# Patient Record
Sex: Female | Born: 1985 | Race: White | Hispanic: Yes | Marital: Single | Smoking: Never smoker
Health system: Southern US, Community
[De-identification: ages and names within clinical notes are randomized; demographics above are authoritative.]

## PROBLEM LIST (undated history)

## (undated) HISTORY — PX: TONSILLECTOMY: SUR1361

---

## 2013-04-04 ENCOUNTER — Encounter (HOSPITAL_COMMUNITY): Payer: Self-pay | Admitting: Emergency Medicine

## 2013-04-04 ENCOUNTER — Emergency Department (HOSPITAL_COMMUNITY)
Admission: EM | Admit: 2013-04-04 | Discharge: 2013-04-04 | Disposition: A | Discharge status: Home / Self Care | Payer: Managed Care, Other (non HMO) | Attending: Emergency Medicine | Admitting: Emergency Medicine

## 2013-04-04 DIAGNOSIS — Z79899 Other long term (current) drug therapy: Secondary | ICD-10-CM | POA: Insufficient documentation

## 2013-04-04 DIAGNOSIS — Z3202 Encounter for pregnancy test, result negative: Secondary | ICD-10-CM | POA: Insufficient documentation

## 2013-04-04 DIAGNOSIS — R42 Dizziness and giddiness: Secondary | ICD-10-CM

## 2013-04-04 LAB — CBC WITH DIFFERENTIAL/PLATELET
Eosinophils Relative: 1 % (ref 0–5)
HCT: 39.4 % (ref 36.0–46.0)
Lymphocytes Relative: 28 % (ref 12–46)
Lymphs Abs: 2.4 10*3/uL (ref 0.7–4.0)
MCV: 88.3 fL (ref 78.0–100.0)
Neutro Abs: 5.6 10*3/uL (ref 1.7–7.7)
Platelets: 289 10*3/uL (ref 150–400)
RBC: 4.46 MIL/uL (ref 3.87–5.11)
WBC: 8.7 10*3/uL (ref 4.0–10.5)

## 2013-04-04 LAB — URINALYSIS, ROUTINE W REFLEX MICROSCOPIC
Bilirubin Urine: NEGATIVE
Hgb urine dipstick: NEGATIVE
Ketones, ur: NEGATIVE mg/dL
Nitrite: NEGATIVE
Protein, ur: NEGATIVE mg/dL
Specific Gravity, Urine: 1.014 (ref 1.005–1.030)
Urobilinogen, UA: 0.2 mg/dL (ref 0.0–1.0)

## 2013-04-04 LAB — COMPREHENSIVE METABOLIC PANEL
ALT: 9 U/L (ref 0–35)
AST: 28 U/L (ref 0–37)
Alkaline Phosphatase: 63 U/L (ref 39–117)
CO2: 26 mEq/L (ref 19–32)
Calcium: 10.1 mg/dL (ref 8.4–10.5)
Chloride: 101 mEq/L (ref 96–112)
GFR calc Af Amer: >90 mL/min (ref >90)
GFR calc non Af Amer: 85 mL/min — ABNORMAL LOW (ref >90)
Glucose, Bld: 94 mg/dL (ref 70–99)
Sodium: 138 mEq/L (ref 135–145)
Total Bilirubin: 0.5 mg/dL (ref 0.3–1.2)

## 2013-04-04 MED ORDER — MECLIZINE HCL 25 MG PO TABS: 25.0000 mg | ORAL_TABLET | Freq: Three times a day (TID) | ORAL | Status: AC | PRN

## 2013-04-04 NOTE — ED Notes (Signed)
PT. REPORTS DIZZINESS , LIGHTHEADED , NAUSEA , SLIGHT CHILLS WITH BODY ACHES ONSET TODAY .

## 2013-04-04 NOTE — ED Notes (Signed)
MD at bedside. 

## 2013-04-04 NOTE — ED Provider Notes (Signed)
CSN: [id number]     Arrival date & time 04/04/13  0157 History     First MD Initiated Contact with Patient 04/04/13 [phone]     Chief Complaint  Patient presents with  . Dizziness   (Consider location/radiation/quality/duration/timing/severity/associated sxs/prior Treatment) The history is provided by the patient.   27 year old female had onset at about 9 PM of a vague sense of dizziness. She states it felt like she was looking through a tunnel and things were moving slowly. This is associated with nausea. She did have some intermittent sweating along with that. She denies fever, chills. She denies arthralgias or myalgias. She felt better when she is laying down but nothing seemed to make her symptoms worse. She actually is feeling better now that she is in the ED.  History reviewed. No pertinent past medical history. History reviewed. No pertinent past surgical history. No family history on file. History  Substance Use Topics  . Smoking status: Never Smoker   . Smokeless tobacco: Not on file  . Alcohol Use: Yes   OB History   Grav Para Term Preterm Abortions TAB SAB Ect Mult Living                 Review of Systems  All other systems reviewed and are negative.    Allergies  Review of patient's allergies indicates no known allergies.  Home Medications   Current Outpatient Rx  Name  Route  Sig  Dispense  Refill  . citalopram (CELEXA) 10 MG tablet   Oral   Take 10 mg by mouth daily.         . meclizine (ANTIVERT) 25 MG tablet   Oral   Take 1 tablet (25 mg total) by mouth 3 (three) times daily as needed.   30 tablet   0    BP 106/63  Pulse 64  Temp(Src) 98.1 F (36.7 C) (Oral)  Resp 16  SpO2 99%  LMP 04/03/2013 Physical Exam  Nursing note and vitals reviewed.  27 year old female, resting comfortably and in no acute distress. Vital signs are normal. Oxygen saturation is 99%, which is normal. Head is normocephalic and atraumatic. PERRLA, EOMI. Oropharynx is  clear.  Slight nystagmus is noted on right lateral gaze without reproducing her symptoms. Neck is nontender and supple without adenopathy or JVD. Back is nontender and there is no CVA tenderness. Lungs are clear without rales, wheezes, or rhonchi. Chest is nontender. Heart has regular rate and rhythm without murmur. Abdomen is soft, flat, nontender without masses or hepatosplenomegaly and peristalsis is normoactive. Extremities have no cyanosis or edema, full range of motion is present. Skin is warm and dry without rash. Neurologic: Mental status is normal, cranial nerves are intact, there are no motor or sensory deficits. Dizziness is incompletely reproduced by head movement.  ED Course   Procedures (including critical care time)  Results for orders placed during the hospital encounter of 04/04/13  CBC WITH DIFFERENTIAL      Result Value Range   WBC 8.7  4.0 - 10.5 K/uL   RBC 4.46  3.87 - 5.11 MIL/uL   Hemoglobin 14.0  12.0 - 15.0 g/dL   HCT 11.9  14.7 - 82.9 %   MCV 88.3  78.0 - 100.0 fL   MCH 31.4  26.0 - 34.0 pg   MCHC 35.5  30.0 - 36.0 g/dL   RDW 56.2  13.0 - 86.5 %   Platelets 289  150 - 400 K/uL   Neutrophils Relative %  64  43 - 77 %   Neutro Abs 5.6  1.7 - 7.7 K/uL   Lymphocytes Relative 28  12 - 46 %   Lymphs Abs 2.4  0.7 - 4.0 K/uL   Monocytes Relative 6  3 - 12 %   Monocytes Absolute 0.5  0.1 - 1.0 K/uL   Eosinophils Relative 1  0 - 5 %   Eosinophils Absolute 0.0  0.0 - 0.7 K/uL   Basophils Relative 1  0 - 1 %   Basophils Absolute 0.1  0.0 - 0.1 K/uL  COMPREHENSIVE METABOLIC PANEL      Result Value Range   Sodium 138  135 - 145 mEq/L   Potassium 3.7  3.5 - 5.1 mEq/L   Chloride 101  96 - 112 mEq/L   CO2 26  19 - 32 mEq/L   Glucose, Bld 94  70 - 99 mg/dL   BUN 16  6 - 23 mg/dL   Creatinine, Ser 0.98  0.50 - 1.10 mg/dL   Calcium 11.9  8.4 - 14.7 mg/dL   Total Protein 7.7  6.0 - 8.3 g/dL   Albumin 4.5  3.5 - 5.2 g/dL   AST 28  0 - 37 U/L   ALT 9  0 - 35 U/L    Alkaline Phosphatase 63  39 - 117 U/L   Total Bilirubin 0.5  0.3 - 1.2 mg/dL   GFR calc non Af Amer 85 (*) >90 mL/min   GFR calc Af Amer >90  >90 mL/min  URINALYSIS, ROUTINE W REFLEX MICROSCOPIC      Result Value Range   Color, Urine YELLOW  YELLOW   APPearance CLOUDY (*) CLEAR   Specific Gravity, Urine 1.014  1.005 - 1.030   pH 7.5  5.0 - 8.0   Glucose, UA NEGATIVE  NEGATIVE mg/dL   Hgb urine dipstick NEGATIVE  NEGATIVE   Bilirubin Urine NEGATIVE  NEGATIVE   Ketones, ur NEGATIVE  NEGATIVE mg/dL   Protein, ur NEGATIVE  NEGATIVE mg/dL   Urobilinogen, UA 0.2  0.0 - 1.0 mg/dL   Nitrite NEGATIVE  NEGATIVE   Leukocytes, UA NEGATIVE  NEGATIVE  POCT PREGNANCY, URINE      Result Value Range   Preg Test, Ur NEGATIVE  NEGATIVE   1. Vertigo     MDM  Dizziness which appears to be an episode of peripheral vertigo. It has almost completely resolved at this point. Laboratory workup is negative. She is discharged with prescription for meclizine.  Dione Booze, MD 04/04/13 [phone]

## 2013-04-04 NOTE — ED Notes (Signed)
Pt stated that she had just gone to the restroom and can't void at this time. Notified that we need a urine sample and asked to let us know when she can go.

## 2015-01-04 ENCOUNTER — Emergency Department (HOSPITAL_COMMUNITY)
Admission: EM | Admit: 2015-01-04 | Discharge: 2015-01-05 | Disposition: A | Discharge status: Home / Self Care | Payer: Managed Care, Other (non HMO) | Attending: Emergency Medicine | Admitting: Emergency Medicine

## 2015-01-04 ENCOUNTER — Encounter (HOSPITAL_COMMUNITY): Payer: Self-pay | Admitting: Emergency Medicine

## 2015-01-04 ENCOUNTER — Encounter (HOSPITAL_COMMUNITY): Payer: Self-pay

## 2015-01-04 ENCOUNTER — Emergency Department (HOSPITAL_COMMUNITY)
Admission: EM | Admit: 2015-01-04 | Discharge: 2015-01-04 | Disposition: A | Discharge status: Other Acute Inpatient Hospital | Payer: Managed Care, Other (non HMO) | Source: Home / Self Care | Attending: Family Medicine | Admitting: Family Medicine

## 2015-01-04 ENCOUNTER — Emergency Department (HOSPITAL_COMMUNITY): Payer: Managed Care, Other (non HMO)

## 2015-01-04 DIAGNOSIS — R1010 Upper abdominal pain, unspecified: Secondary | ICD-10-CM

## 2015-01-04 DIAGNOSIS — R1013 Epigastric pain: Secondary | ICD-10-CM | POA: Diagnosis not present

## 2015-01-04 DIAGNOSIS — R1011 Right upper quadrant pain: Secondary | ICD-10-CM | POA: Diagnosis not present

## 2015-01-04 DIAGNOSIS — R1012 Left upper quadrant pain: Principal | ICD-10-CM

## 2015-01-04 DIAGNOSIS — Z79899 Other long term (current) drug therapy: Secondary | ICD-10-CM | POA: Diagnosis not present

## 2015-01-04 DIAGNOSIS — Z3202 Encounter for pregnancy test, result negative: Secondary | ICD-10-CM | POA: Diagnosis not present

## 2015-01-04 LAB — URINALYSIS, ROUTINE W REFLEX MICROSCOPIC
Bilirubin Urine: NEGATIVE
GLUCOSE, UA: NEGATIVE mg/dL
Hgb urine dipstick: NEGATIVE
Ketones, ur: NEGATIVE mg/dL
Leukocytes, UA: NEGATIVE
Nitrite: NEGATIVE
PROTEIN: NEGATIVE mg/dL
Specific Gravity, Urine: 1.031 — ABNORMAL HIGH (ref 1.005–1.030)
UROBILINOGEN UA: 0.2 mg/dL (ref 0.0–1.0)
pH: 5.5 (ref 5.0–8.0)

## 2015-01-04 LAB — COMPREHENSIVE METABOLIC PANEL
ALBUMIN: 4.1 g/dL (ref 3.5–5.2)
ALT: 8 U/L (ref 0–35)
AST: 19 U/L (ref 0–37)
Alkaline Phosphatase: 51 U/L (ref 39–117)
Anion gap: 8 (ref 5–15)
BUN: 13 mg/dL (ref 6–23)
CHLORIDE: 104 mmol/L (ref 96–112)
CO2: 26 mmol/L (ref 19–32)
Calcium: 9.3 mg/dL (ref 8.4–10.5)
Creatinine, Ser: 0.82 mg/dL (ref 0.50–1.10)
GFR calc non Af Amer: >90 mL/min (ref >90)
GLUCOSE: 95 mg/dL (ref 70–99)
POTASSIUM: 4.4 mmol/L (ref 3.5–5.1)
SODIUM: 138 mmol/L (ref 135–145)
TOTAL PROTEIN: 6.9 g/dL (ref 6.0–8.3)
Total Bilirubin: 0.7 mg/dL (ref 0.3–1.2)

## 2015-01-04 LAB — CBC WITH DIFFERENTIAL/PLATELET
BASOS ABS: 0 10*3/uL (ref 0.0–0.1)
BASOS PCT: 1 % (ref 0–1)
EOS PCT: 1 % (ref 0–5)
Eosinophils Absolute: 0.1 10*3/uL (ref 0.0–0.7)
HEMATOCRIT: 39.6 % (ref 36.0–46.0)
Hemoglobin: 13.3 g/dL (ref 12.0–15.0)
LYMPHS ABS: 1.7 10*3/uL (ref 0.7–4.0)
LYMPHS PCT: 40 % (ref 12–46)
MCH: 29.3 pg (ref 26.0–34.0)
MCHC: 33.6 g/dL (ref 30.0–36.0)
MCV: 87.2 fL (ref 78.0–100.0)
MONO ABS: 0.5 10*3/uL (ref 0.1–1.0)
MONOS PCT: 11 % (ref 3–12)
NEUTROS ABS: 2 10*3/uL (ref 1.7–7.7)
NEUTROS PCT: 47 % (ref 43–77)
Platelets: 236 10*3/uL (ref 150–400)
RBC: 4.54 MIL/uL (ref 3.87–5.11)
RDW: 12.9 % (ref 11.5–15.5)
WBC: 4.3 10*3/uL (ref 4.0–10.5)

## 2015-01-04 LAB — LIPASE, BLOOD: Lipase: 33 U/L (ref 11–59)

## 2015-01-04 LAB — TROPONIN I: Troponin I: <0.03 ng/mL (ref <0.031)

## 2015-01-04 LAB — POC URINE PREG, ED: Preg Test, Ur: NEGATIVE

## 2015-01-04 MED ORDER — GI COCKTAIL ~~LOC~~
ORAL | Status: AC
  Filled 2015-01-04: qty 30

## 2015-01-04 MED ORDER — GI COCKTAIL ~~LOC~~
30.0000 mL | Freq: Once | ORAL | Status: AC
  Administered 2015-01-04: 30 mL via ORAL

## 2015-01-04 MED ORDER — GI COCKTAIL ~~LOC~~
30.0000 mL | Freq: Once | ORAL | Status: DC
Start: 2015-01-04 — End: 2015-01-04

## 2015-01-04 NOTE — ED Notes (Signed)
C/o upper abdominal pain , bloating over 1 week. Similar episode a couple of yrs ago, resolved w OTC medications.

## 2015-01-04 NOTE — ED Notes (Addendum)
C/o sharp stabbing upper abd pain x 1 week.  Denies nausea, vomiting, diarrhea, constipation, or urinary complaint.  States she feels like abd is bloated.  [facility]  LMP 01/02/15

## 2015-01-04 NOTE — ED Provider Notes (Signed)
CSN: [id number]     Arrival date & time 01/04/15  1956 History   First MD Initiated Contact with Patient 01/04/15 2208     Chief Complaint  Patient presents with  . Abdominal Pain     (Consider location/radiation/quality/duration/timing/severity/associated sxs/prior Treatment) HPI Comments: Patient is a 29 year old female with no past medical history who presents with abdominal pain that started 3 days ago. The pain is located in the RUQ and epigastric and does not radiate. The pain is described as aching and severe. The pain started gradually and progressively worsened since the onset. No alleviating/aggravating factors. The patient has tried nothing for symptoms without relief. Associated symptoms include nausea. Patient denies fever, headache, vomiting, diarrhea, chest pain, SOB, dysuria, constipation, abnormal vaginal bleeding/discharge.      History reviewed. No pertinent past medical history. History reviewed. No pertinent past surgical history. No family history on file. History  Substance Use Topics  . Smoking status: Never Smoker   . Smokeless tobacco: Not on file  . Alcohol Use: Yes   OB History    No data available     Review of Systems  Gastrointestinal: Positive for nausea and abdominal pain.  All other systems reviewed and are negative.     Allergies  Review of patient's allergies indicates no known allergies.  Home Medications   Prior to Admission medications   Medication Sig Start Date End Date Taking? Authorizing Provider  citalopram (CELEXA) 10 MG tablet Take 10 mg by mouth daily.    Historical Provider, MD  meclizine (ANTIVERT) 25 MG tablet Take 1 tablet (25 mg total) by mouth 3 (three) times daily as needed. 04/04/13   Dione Booze, MD   BP 110/74 mmHg  Pulse 60  Temp(Src) 97.4 F (36.3 C) (Oral)  Resp 16  Ht  (1.651 m)  Wt 150 lb (68.04 kg)  BMI 24.96 kg/m2  SpO2 100%  LMP 01/02/2015 Physical Exam  Constitutional: She is oriented to  person, place, and time. She appears well-developed and well-nourished. No distress.  HENT:  Head: Normocephalic and atraumatic.  Eyes: Conjunctivae and EOM are normal.  Neck: Normal range of motion.  Cardiovascular: Normal rate and regular rhythm.  Exam reveals no gallop and no friction rub.   No murmur heard. Pulmonary/Chest: Effort normal and breath sounds normal. She has no wheezes. She has no rales. She exhibits no tenderness.  Abdominal: Soft. She exhibits no distension. There is tenderness. There is no rebound.  Epigastric and RUQ tenderness to palpation. No other focal tenderness.   Musculoskeletal: Normal range of motion.  Neurological: She is alert and oriented to person, place, and time. Coordination normal.  Speech is goal-oriented. Moves limbs without ataxia.   Skin: Skin is warm and dry.  Psychiatric: She has a normal mood and affect. Her behavior is normal.  Nursing note and vitals reviewed.   ED Course  Procedures (including critical care time) Labs Review Labs Reviewed  URINALYSIS, ROUTINE W REFLEX MICROSCOPIC - Abnormal; Notable for the following:    Specific Gravity, Urine 1.031 (*)    All other components within normal limits  CBC WITH DIFFERENTIAL/PLATELET  COMPREHENSIVE METABOLIC PANEL  LIPASE, BLOOD  TROPONIN I  POC URINE PREG, ED    Imaging Review US Abdomen Complete  01/05/2015   CLINICAL DATA:  Abdominal pain  EXAM: ULTRASOUND ABDOMEN COMPLETE  COMPARISON:  None.  FINDINGS: Gallbladder: No gallstones or wall thickening visualized. No sonographic Murphy sign noted.  Common bile duct: Diameter: Normal, 4  mm  Liver: No focal lesion identified. Within normal limits in parenchymal echogenicity.  IVC: No abnormality visualized.  Pancreas: Visualized portion unremarkable.  Spleen: Size and appearance within normal limits.  Right Kidney: Length: 10.8 cm. Echogenicity within normal limits. No mass or hydronephrosis visualized.  Left Kidney: Length: 12.0 cm.  Echogenicity within normal limits. No mass or hydronephrosis visualized.  Abdominal aorta: No aneurysm visualized.  Other findings: No ascites.  IMPRESSION: Normal abdominal ultrasound.   Electronically Signed   By: Jeronimo GreavesKyle  Talbot M.D.   On: 01/05/2015 00:18     EKG Interpretation None      MDM   Final diagnoses:  RUQ abdominal pain   10:43 PM Labs unremarkable. RUQ US pending.   US unremarkable for acute changes. Patient will have vicodin and zofran for symptoms.     Emilia BeckKaitlyn Chimamanda Siegfried, PA-C 01/05/15 [phone]  Geoffery Lyonsouglas Delo, MD 01/05/15 [phone]

## 2015-01-04 NOTE — ED Provider Notes (Addendum)
CSN: [id number]     Arrival date & time 01/04/15  1729 History   First MD Initiated Contact with Patient 01/04/15 1834     Chief Complaint  Patient presents with  . Abdominal Pain   (Consider location/radiation/quality/duration/timing/severity/associated sxs/prior Treatment) Patient is a 29 y.o. female presenting with abdominal pain. The history is provided by the patient.  Abdominal Pain Pain location:  LUQ and epigastric Pain quality: burning   Pain quality: not cramping   Pain radiates to:  Does not radiate Pain severity:  Moderate Onset quality:  Gradual Duration:  1 week Timing:  Intermittent Progression:  Waxing and waning Chronicity:  Recurrent ([place] that she had ulcers.) Context: not eating   Relieved by:  Nothing Ineffective treatments:  OTC medications Associated symptoms: nausea   Associated symptoms: no anorexia, no belching, no chest pain, no chills, no constipation, no diarrhea, no fever, no melena and no vomiting   Risk factors: no NSAID use and not obese     History reviewed. No pertinent past medical history. History reviewed. No pertinent past surgical history. History reviewed. No pertinent family history. History  Substance Use Topics  . Smoking status: Never Smoker   . Smokeless tobacco: Not on file  . Alcohol Use: Yes   OB History    No data available     Review of Systems  Constitutional: Negative.  Negative for fever and chills.  Respiratory: Negative.   Cardiovascular: Negative for chest pain.  Gastrointestinal: Positive for nausea and abdominal pain. Negative for vomiting, diarrhea, constipation, melena and anorexia.    Allergies  Review of patient's allergies indicates no known allergies.  Home Medications   Prior to Admission medications   Medication Sig Start Date End Date Taking? Authorizing Provider  citalopram (CELEXA) 10 MG tablet Take 10 mg by mouth daily.    Historical Provider, MD  meclizine  (ANTIVERT) 25 MG tablet Take 1 tablet (25 mg total) by mouth 3 (three) times daily as needed. 04/04/13   Dione Boozeavid Glick, MD   BP 117/77 mmHg  Pulse 59  Temp(Src) 98.2 F (36.8 C) (Oral)  Resp 16  SpO2 98%  LMP 01/02/2015 Physical Exam  Constitutional: She is oriented to person, place, and time. She appears well-developed and well-nourished. She appears distressed.  Neck: Normal range of motion. Neck supple.  Cardiovascular: Normal heart sounds.   Pulmonary/Chest: Effort normal and breath sounds normal.  Abdominal: Soft. Normal appearance and bowel sounds are normal. She exhibits no distension and no mass. There is no hepatosplenomegaly. There is tenderness in the epigastric area and left upper quadrant. There is no rebound, no guarding and no CVA tenderness.    Lymphadenopathy:    She has no cervical adenopathy.  Neurological: She is alert and oriented to person, place, and time.  Skin: Skin is warm and dry.  Nursing note and vitals reviewed.   ED Course  Procedures (including critical care time) Labs Review Labs Reviewed - No data to display  Imaging Review No results found.   MDM   1. Bilateral upper abdominal pain    Upper abd crampy pain , no relief with gi cocktail, r/o cholecyst. Sx for 1 week,    Linna HoffJames D Khiree Bukhari, MD 01/04/15 1933  Linna HoffJames D Frazier Balfour, MD 01/04/15 2126

## 2015-01-05 MED ORDER — HYDROCODONE-ACETAMINOPHEN 5-325 MG PO TABS: 2.0000 | ORAL_TABLET | ORAL | Status: AC | PRN

## 2015-01-05 MED ORDER — ONDANSETRON 4 MG PO TBDP: 4.0000 mg | ORAL_TABLET | Freq: Three times a day (TID) | ORAL | Status: AC | PRN

## 2015-01-05 NOTE — Discharge Instructions (Signed)
Take Vicodin as needed for pain. Take zofran as needed for nausea. Follow up with your doctor as needed. Refer to attached documents for more information.

## 2015-04-12 ENCOUNTER — Encounter (HOSPITAL_BASED_OUTPATIENT_CLINIC_OR_DEPARTMENT_OTHER): Payer: Self-pay

## 2015-04-12 ENCOUNTER — Emergency Department (HOSPITAL_BASED_OUTPATIENT_CLINIC_OR_DEPARTMENT_OTHER): Payer: Managed Care, Other (non HMO)

## 2015-04-12 ENCOUNTER — Emergency Department (HOSPITAL_BASED_OUTPATIENT_CLINIC_OR_DEPARTMENT_OTHER)
Admission: EM | Admit: 2015-04-12 | Discharge: 2015-04-12 | Disposition: A | Discharge status: Home / Self Care | Payer: Managed Care, Other (non HMO) | Attending: Emergency Medicine | Admitting: Emergency Medicine

## 2015-04-12 DIAGNOSIS — Y9389 Activity, other specified: Secondary | ICD-10-CM | POA: Diagnosis not present

## 2015-04-12 DIAGNOSIS — Z79899 Other long term (current) drug therapy: Secondary | ICD-10-CM | POA: Insufficient documentation

## 2015-04-12 DIAGNOSIS — Z3202 Encounter for pregnancy test, result negative: Secondary | ICD-10-CM | POA: Insufficient documentation

## 2015-04-12 DIAGNOSIS — Y92410 Unspecified street and highway as the place of occurrence of the external cause: Secondary | ICD-10-CM | POA: Insufficient documentation

## 2015-04-12 DIAGNOSIS — Y998 Other external cause status: Secondary | ICD-10-CM | POA: Diagnosis not present

## 2015-04-12 DIAGNOSIS — S3992XA Unspecified injury of lower back, initial encounter: Secondary | ICD-10-CM | POA: Diagnosis present

## 2015-04-12 DIAGNOSIS — S39012A Strain of muscle, fascia and tendon of lower back, initial encounter: Secondary | ICD-10-CM | POA: Diagnosis not present

## 2015-04-12 DIAGNOSIS — S79912A Unspecified injury of left hip, initial encounter: Secondary | ICD-10-CM | POA: Diagnosis not present

## 2015-04-12 LAB — PREGNANCY, URINE: Preg Test, Ur: NEGATIVE

## 2015-04-12 MED ORDER — METHOCARBAMOL 500 MG PO TABS: 500.0000 mg | ORAL_TABLET | Freq: Two times a day (BID) | ORAL | Status: AC

## 2015-04-12 MED ORDER — NAPROXEN 500 MG PO TABS: 500.0000 mg | ORAL_TABLET | Freq: Two times a day (BID) | ORAL | Status: AC

## 2015-04-12 NOTE — Discharge Instructions (Signed)
Back Pain, Adult Low back pain is very common. About 1 in 5 people have back pain.The cause of low back pain is rarely dangerous. The pain often gets better over time.About half of people with a sudden onset of back pain feel better in just 2 weeks. About 8 in 10 people feel better by 6 weeks.  CAUSES Some common causes of back pain include:  Strain of the muscles or ligaments supporting the spine.  Wear and tear (degeneration) of the spinal discs.  Arthritis.  Direct injury to the back. DIAGNOSIS Most of the time, the direct cause of low back pain is not known.However, back pain can be treated effectively even when the exact cause of the pain is unknown.Answering your caregiver's questions about your overall health and symptoms is one of the most accurate ways to make sure the cause of your pain is not dangerous. If your caregiver needs more information, he or she may order lab work or imaging tests (X-rays or MRIs).However, even if imaging tests show changes in your back, this usually does not require surgery. HOME CARE INSTRUCTIONS For many people, back pain returns.Since low back pain is rarely dangerous, it is often a condition that people can learn to manageon their own.   Remain active. It is stressful on the back to sit or stand in one place. Do not sit, drive, or stand in one place for more than 30 minutes at a time. Take short walks on level surfaces as soon as pain allows.Try to increase the length of time you walk each day.  Do not stay in bed.Resting more than 1 or 2 days can delay your recovery.  Do not avoid exercise or work.Your body is made to move.It is not dangerous to be active, even though your back may hurt.Your back will likely heal faster if you return to being active before your pain is gone.  Pay attention to your body when you bend and lift. Many people have less discomfortwhen lifting if they bend their knees, keep the load close to their bodies,and  avoid twisting. Often, the most comfortable positions are those that put less stress on your recovering back.  Find a comfortable position to sleep. Use a firm mattress and lie on your side with your knees slightly bent. If you lie on your back, put a pillow under your knees.  Only take over-the-counter or prescription medicines as directed by your caregiver. Over-the-counter medicines to reduce pain and inflammation are often the most helpful.Your caregiver may prescribe muscle relaxant drugs.These medicines help dull your pain so you can more quickly return to your normal activities and healthy exercise.  Put ice on the injured area.  Put ice in a plastic bag.  Place a towel between your skin and the bag.  Leave the ice on for 15-20 minutes, 03-04 times a day for the first 2 to 3 days. After that, ice and heat may be alternated to reduce pain and spasms.  Ask your caregiver about trying back exercises and gentle massage. This may be of some benefit.  Avoid feeling anxious or stressed.Stress increases muscle tension and can worsen back pain.It is important to recognize when you are anxious or stressed and learn ways to manage it.Exercise is a great option. SEEK MEDICAL CARE IF:  You have pain that is not relieved with rest or medicine.  You have pain that does not improve in 1 week.  You have new symptoms.  You are generally not feeling well. SEEK   IMMEDIATE MEDICAL CARE IF:   You have pain that radiates from your back into your legs.  You develop new bowel or bladder control problems.  You have unusual weakness or numbness in your arms or legs.  You develop nausea or vomiting.  You develop abdominal pain.  You feel faint. Document Released: 08/24/2005 Document Revised: 02/23/2012 Document Reviewed: 12/26/2013 [employer] Patient Information 2015 [employer]. This information is not intended to replace advice given to you by your health care provider. Make sure you  discuss any questions you have with your health care provider.  
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
